# Patient Record
Sex: Male | Born: 1990 | Race: White | Hispanic: No | Marital: Single | Smoking: Current some day smoker
Health system: Southern US, Community
[De-identification: ages and names within clinical notes are randomized; demographics above are authoritative.]

---

## 2015-10-19 ENCOUNTER — Emergency Department (HOSPITAL_COMMUNITY)
Admission: EM | Admit: 2015-10-19 | Discharge: 2015-10-19 | Disposition: A | Discharge status: Home / Self Care | Payer: 59 | Attending: Emergency Medicine | Admitting: Emergency Medicine

## 2015-10-19 ENCOUNTER — Encounter (HOSPITAL_COMMUNITY): Payer: Self-pay | Admitting: Emergency Medicine

## 2015-10-19 ENCOUNTER — Emergency Department (HOSPITAL_COMMUNITY): Payer: 59

## 2015-10-19 DIAGNOSIS — Y999 Unspecified external cause status: Secondary | ICD-10-CM | POA: Diagnosis not present

## 2015-10-19 DIAGNOSIS — S6992XA Unspecified injury of left wrist, hand and finger(s), initial encounter: Secondary | ICD-10-CM | POA: Insufficient documentation

## 2015-10-19 DIAGNOSIS — Z720 Tobacco use: Secondary | ICD-10-CM | POA: Insufficient documentation

## 2015-10-19 DIAGNOSIS — Y939 Activity, unspecified: Secondary | ICD-10-CM | POA: Diagnosis not present

## 2015-10-19 DIAGNOSIS — W010XXA Fall on same level from slipping, tripping and stumbling without subsequent striking against object, initial encounter: Secondary | ICD-10-CM | POA: Insufficient documentation

## 2015-10-19 DIAGNOSIS — Y929 Unspecified place or not applicable: Secondary | ICD-10-CM | POA: Diagnosis not present

## 2015-10-19 MED ORDER — IBUPROFEN 800 MG PO TABS: 800.0000 mg | ORAL_TABLET | Freq: Three times a day (TID) | ORAL | Status: AC | PRN

## 2015-10-19 MED ORDER — IBUPROFEN 800 MG PO TABS
800.0000 mg | ORAL_TABLET | Freq: Once | ORAL | Status: AC
  Administered 2015-10-19: 800 mg via ORAL
  Filled 2015-10-19: qty 1

## 2015-10-19 MED ORDER — HYDROCODONE-ACETAMINOPHEN 5-325 MG PO TABS: 1.0000 | ORAL_TABLET | ORAL | Status: AC | PRN

## 2015-10-19 NOTE — ED Notes (Signed)
Fell on left wrist last night.  States I think I broke it.  Noted to be swollen and tender.

## 2015-10-19 NOTE — ED Provider Notes (Signed)
TIME SEEN: 6:10 AM  CHIEF COMPLAINT: Left wrist pain  HPI: Pt is a 24 y.o. right-hand-dominant male with no significant past medical history who presents to the emergency department with left wrist pain. States that he tripped and fell last night catching himself with his left hand. Has had pain and swelling to the wrist. Fall happened at 6 PM yesterday. States he did not hit his head or lose consciousness. No complaints of pain anywhere else. Normal sensation throughout the hand. Able to move his fingers without difficulty.  ROS: See HPI Constitutional: no fever  Eyes: no drainage  ENT: no runny nose   Cardiovascular:  no chest pain  Resp: no SOB  GI: no vomiting GU: no dysuria Integumentary: no rash  Allergy: no hives  Musculoskeletal: no leg swelling  Neurological: no slurred speech ROS otherwise negative  PAST MEDICAL HISTORY/PAST SURGICAL HISTORY:  No past medical history on file.  MEDICATIONS:  Prior to Admission medications   Not on File    ALLERGIES:  Allergies not on file  SOCIAL HISTORY:  Social History  Substance Use Topics  . Smoking status: Not on file  . Smokeless tobacco: Not on file  . Alcohol Use: Not on file    FAMILY HISTORY: No family history on file.  EXAM: BP 126/81 mmHg  Pulse 105  Temp(Src) 99 F (37.2 C) (Oral)  Resp 19  Ht 6' (1.829 m)  Wt 145 lb (65.772 kg)  BMI 19.66 kg/m2  SpO2 98% CONSTITUTIONAL: Alert and oriented and responds appropriately to questions. Well-appearing; well-nourished; GCS 15 HEAD: Normocephalic; atraumatic EYES: Conjunctivae clear, PERRL, EOMI ENT: normal nose; no rhinorrhea; moist mucous membranes; pharynx without lesions noted; no dental injury; no septal hematoma NECK: Supple, no meningismus, no LAD; no midline spinal tenderness, step-off or deformity CARD: RRR; S1 and S2 appreciated; no murmurs, no clicks, no rubs, no gallops RESP: Normal chest excursion without splinting or tachypnea; breath sounds clear  and equal bilaterally; no wheezes, no rhonchi, no rales; no hypoxia or respiratory distress CHEST:  chest wall stable, no crepitus or ecchymosis or deformity, nontender to palpation ABD/GI: Normal bowel sounds; non-distended; soft, non-tender, no rebound, no guarding PELVIS:  stable, nontender to palpation BACK:  The back appears normal and is non-tender to palpation, there is no CVA tenderness; no midline spinal tenderness, step-off or deformity EXT: Tender over the left dorsal wrist diffusely with associated swelling and deformity over the radial aspect, 2+ radial pulse on the left side, normal sensation throughout the left arm, normal range of motion in the fingers of the left hand and left elbow and left shoulder. Normal capillary refill in the left hand. Decreased range of motion in left wrist secondary to pain Otherwise Normal ROM in all joints; otherwise extremities are non-tender to palpation; no edema; normal capillary refill; no cyanosis, no bony tenderness or bony deformity of patient's extremities, no joint effusion, no ecchymosis or lacerations    SKIN: Normal color for age and race; warm NEURO: Moves all extremities equally, sensation to light touch intact diffusely, cranial nerves II through XII intact PSYCH: The patient's mood and manner are appropriate. Grooming and personal hygiene are appropriate.  MEDICAL DECISION MAKING: Patient here with mechanical fall with left wrist injury. X-ray shows no acute fracture. Patient is tender and has swelling over the scaphoid. We'll place him in a wrist splint and have him follow-up with hand surgery in 1-2 weeks. We'll discharge with pain medication. Have advised to keep it elevated, apply ice.  I do not feel there is any life-threatening condition present. Discussed all results, exam findings with patient. I feel the patient is safe to be discharged home without further emergent workup. Discussed usual and customary return precautions. Patient and  family (if present) verbalize understanding and are comfortable with this plan.  Patient will follow-up with their primary care provider. If they do not have a primary care provider, information for follow-up has been provided to them. All questions have been answered.        Layla Maw Ward, DO 10/19/15 [phone]

## 2015-10-19 NOTE — ED Notes (Signed)
Back from Xray.  Ice pack applied to wrist.

## 2015-10-19 NOTE — Discharge Instructions (Signed)
Wrist Pain °There are many things that can cause wrist pain. Some common causes include: °· An injury to the wrist area, such as a sprain, strain, or fracture. °· Overuse of the joint. °· A condition that causes increased pressure on a nerve in the wrist (carpal tunnel syndrome). °· Wear and tear of the joints that occurs with aging (osteoarthritis). °· A variety of other types of arthritis. °Sometimes, the cause of wrist pain is not known. The pain often goes away when you follow your health care provider's instructions for relieving pain at home. If your wrist pain continues, tests may need to be done to diagnose your condition. °HOME CARE INSTRUCTIONS °Pay attention to any changes in your symptoms. Take these actions to help with your pain: °· Rest the wrist area for at least 48 hours or as told by your health care provider. °· If directed, apply ice to the injured area: °· Put ice in a plastic bag. °· Place a towel between your skin and the bag. °· Leave the ice on for 20 minutes, 2-3 times per day. °· Keep your arm raised (elevated) above the level of your heart while you are sitting or lying down. °· If a splint or elastic bandage has been applied, use it as told by your health care provider. °· Remove the splint or bandage only as told by your health care provider. °· Loosen the splint or bandage if your fingers become numb or have a tingling feeling, or if they turn cold or blue. °· Take over-the-counter and prescription medicines only as told by your health care provider. °· Keep all follow-up visits as told by your health care provider. This is important. °SEEK MEDICAL CARE IF: °· Your pain is not helped by treatment. °· Your pain gets worse. °SEEK IMMEDIATE MEDICAL CARE IF: °· Your fingers become swollen. °· Your fingers turn white, very red, or cold and blue. °· Your fingers are numb or have a tingling feeling. °· You have difficulty moving your fingers. °  °This information is not intended to replace  advice given to you by your health care provider. Make sure you discuss any questions you have with your health care provider. °  °Document Released: 09/14/2005 Document Revised: 08/26/2015 Document Reviewed: 04/22/2015 °[employer]vier Interactive Patient Education ©2016 [employer] °RICE for Routine Care of Injuries °The routine care of many injuries includes rest, ice, compression, and elevation (RICE therapy). RICE therapy is often recommended for injuries to soft tissues, such as a muscle strain, ligament injuries, bruises, and overuse injuries. It can also be used for some bony injuries. Using RICE therapy can help to relieve pain, lessen swelling, and enable your body to heal. °Rest °Rest is required to allow your body to heal. This usually involves reducing your normal activities and avoiding use of the injured part of your body. Generally, you can return to your normal activities when you are comfortable and have been given permission by your health care provider. °Ice °Icing your injury helps to keep the swelling down, and it lessens pain. Do not apply ice directly to your skin. °· Put ice in a plastic bag. °· Place a towel between your skin and the bag. °· Leave the ice on for 20 minutes, 2-3 times a day. °Do this for as long as you are directed by your health care provider. °Compression °Compression means putting pressure on the injured area. Compression helps to keep swelling down, gives support, and helps with discomfort. Compression may be done with an elastic bandage.   If an elastic bandage has been applied, follow these general tips: °· Remove and reapply the bandage every 3-4 hours or as directed by your health care provider. °· Make sure the bandage is not wrapped too tightly, because this can cut off circulation. If part of your body beyond the bandage becomes blue, numb, cold, swollen, or more painful, your bandage is most likely too tight. If this occurs, remove your bandage and reapply it more  loosely. °· See your health care provider if the bandage seems to be making your problems worse rather than better. °Elevation °Elevation means keeping the injured area raised. This helps to lessen swelling and decrease pain. If possible, your injured area should be elevated at or above the level of your heart or the center of your chest. °WHEN SHOULD I SEEK MEDICAL CARE? °You should seek medical care if: °· Your pain and swelling continue. °· Your symptoms are getting worse rather than improving. °These symptoms may indicate that further evaluation or further X-rays are needed. Sometimes, X-rays may not show a small broken bone (fracture) until a number of days later. Make a follow-up appointment with your health care provider. °WHEN SHOULD I SEEK IMMEDIATE MEDICAL CARE? °You should seek immediate medical care if: °· You have sudden severe pain at or below the area of your injury. °· You have redness or increased swelling around your injury. °· You have tingling or numbness at or below the area of your injury that does not improve after you remove the elastic bandage. °  °This information is not intended to replace advice given to you by your health care provider. Make sure you discuss any questions you have with your health care provider. °  °Document Released: 03/19/2001 Document Revised: 08/26/2015 Document Reviewed: 11/12/2014 °[employer]vier Interactive Patient Education ©2016 [employer] ° °

## 2015-10-20 ENCOUNTER — Encounter (HOSPITAL_BASED_OUTPATIENT_CLINIC_OR_DEPARTMENT_OTHER): Payer: Self-pay | Admitting: Emergency Medicine

## 2017-05-01 IMAGING — CR DG WRIST COMPLETE 3+V*L*
4 series · 4 of 4 positions shown · non-contrast
Comparison: None.

CLINICAL DATA: Initial evaluation for acute trauma, fall, wrist
pain.

EXAM:
LEFT WRIST - COMPLETE 3+ VIEW

[wrist pa]
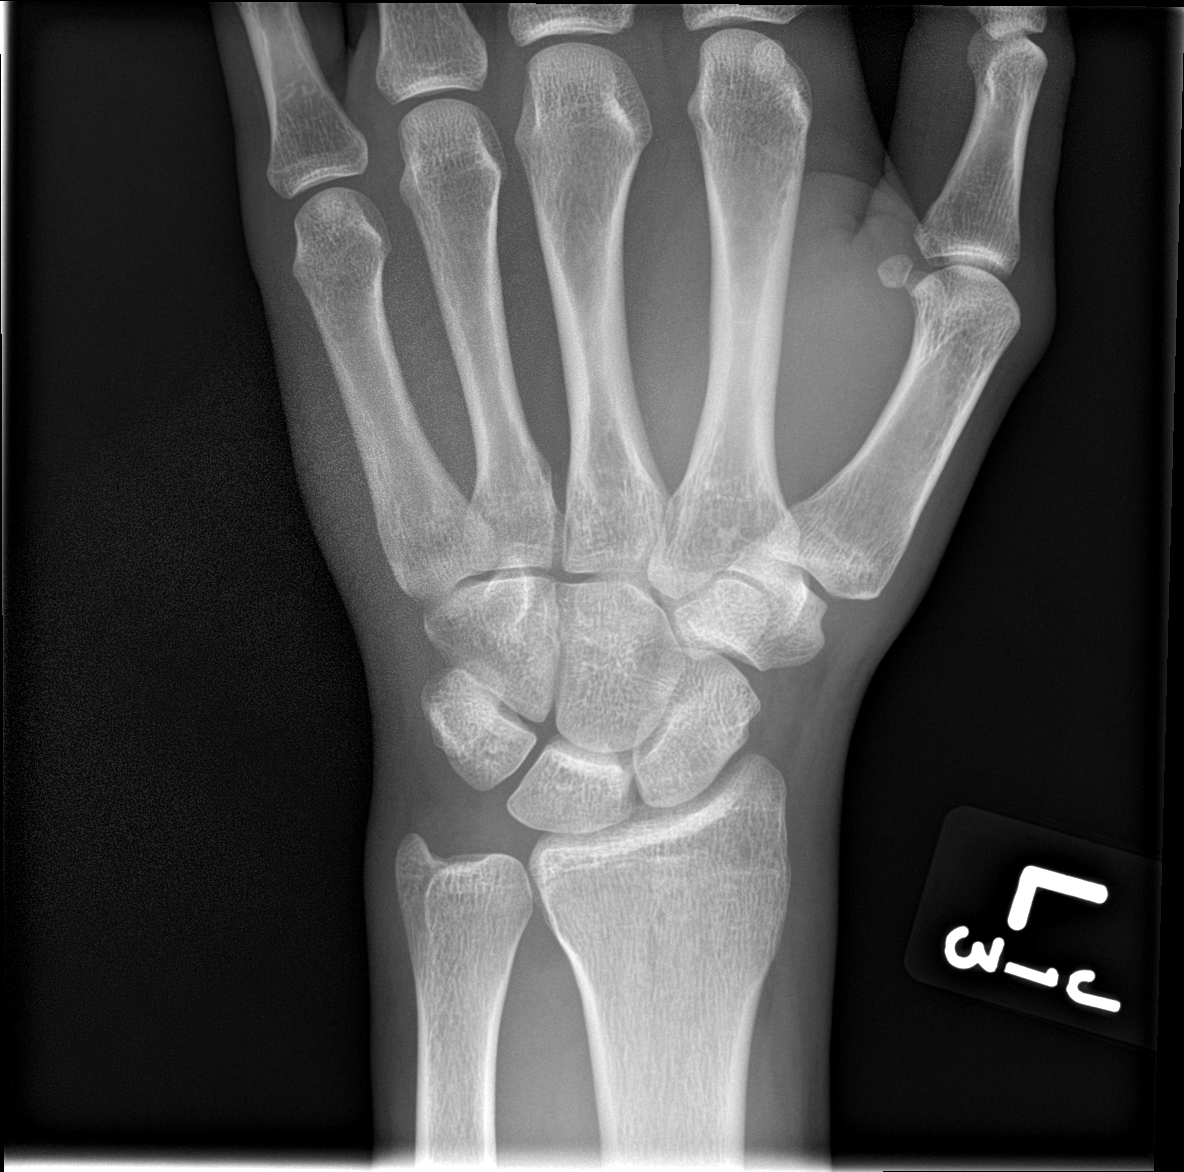

[wrist obl]
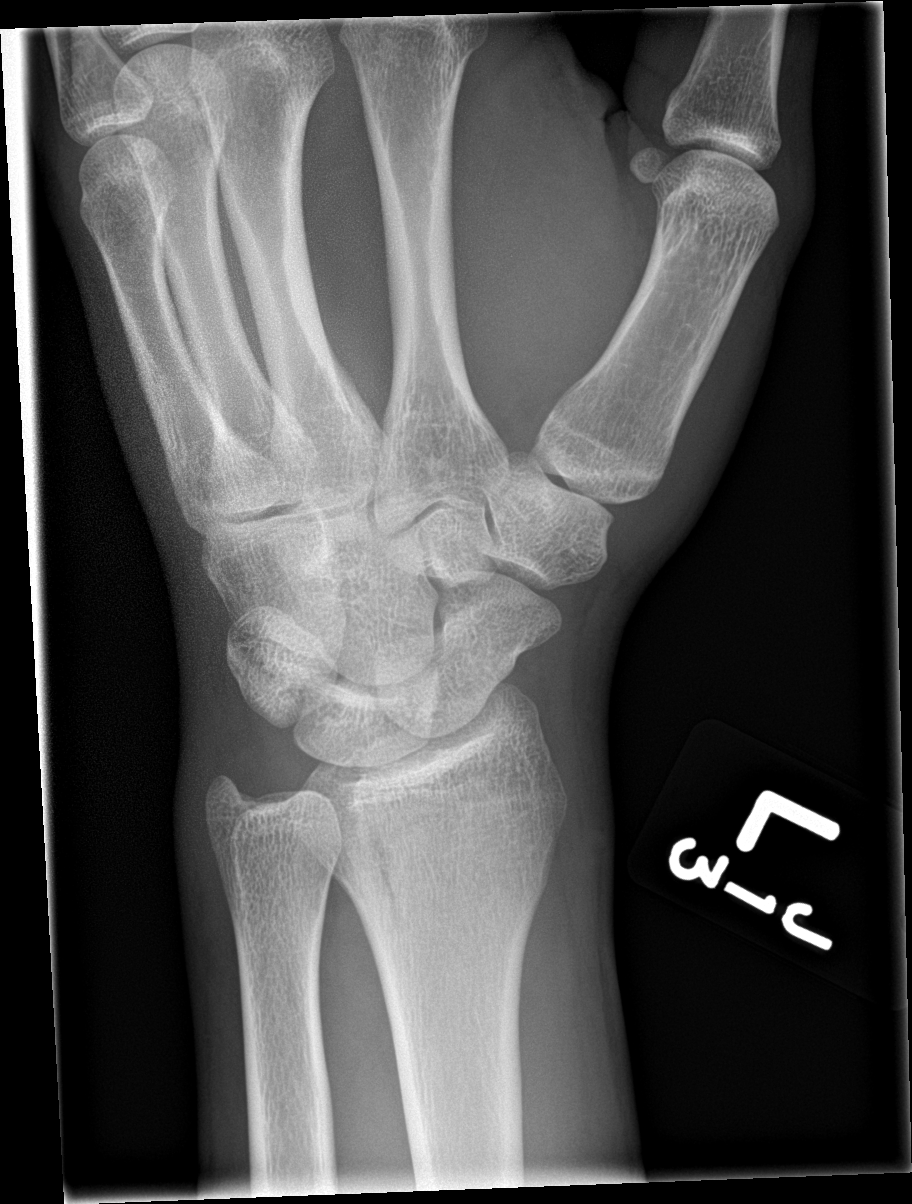

[wrist lat]
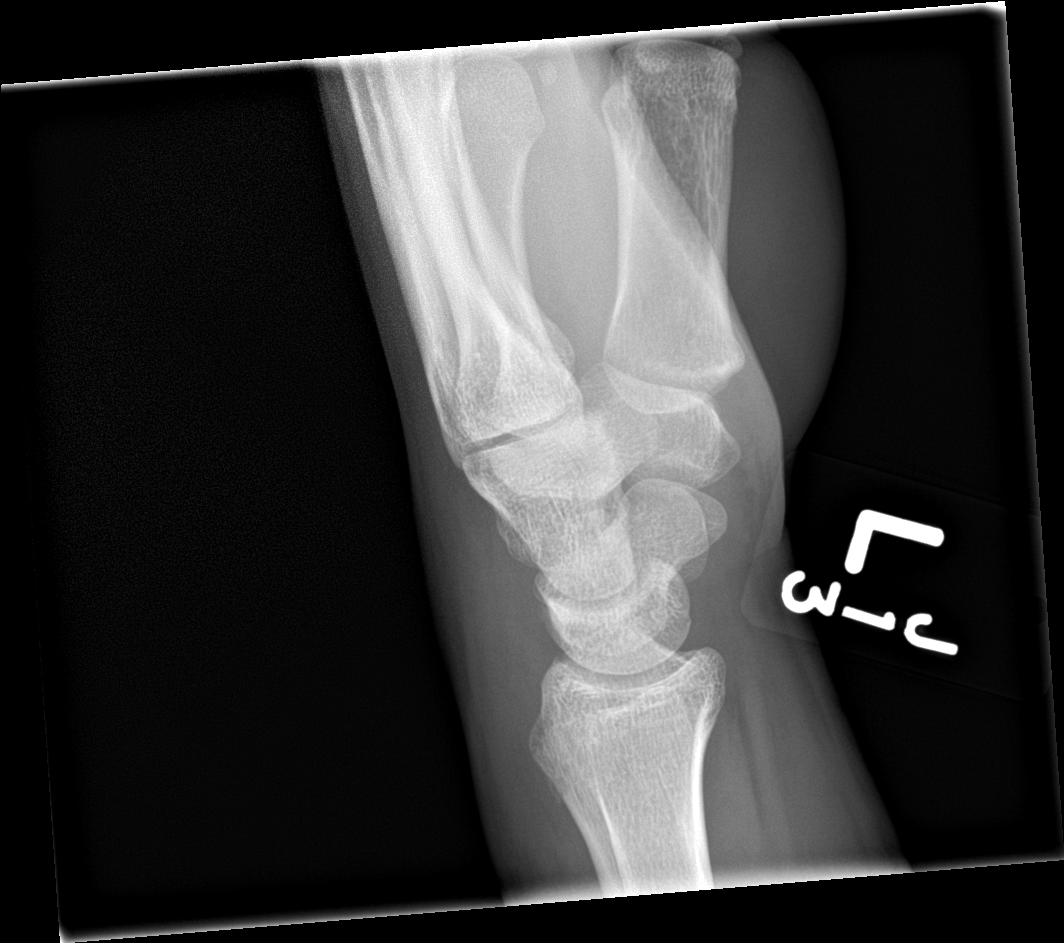

[wrist navicular]
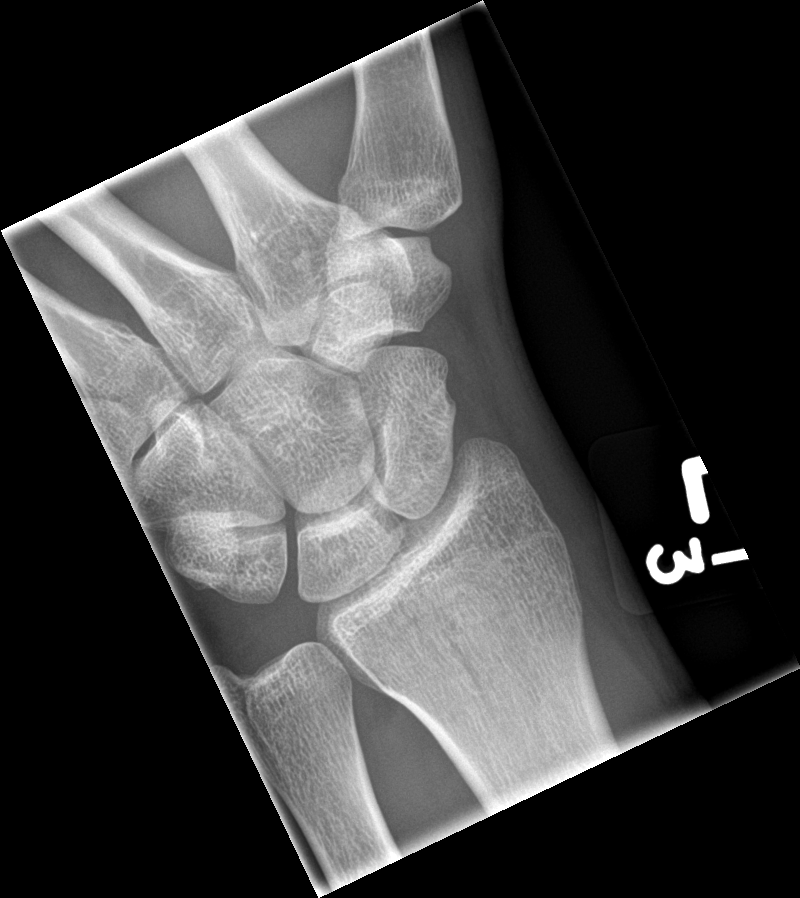

[4 of 4 positions shown; findings below may reference images not displayed]

FINDINGS: There is no evidence of fracture or dislocation. There is no
evidence of arthropathy or other focal bone abnormality. Soft
tissues are unremarkable.
IMPRESSION: Negative.
